# Patient Record
Sex: Male | Born: 1980 | Race: Black or African American
Health system: Southern US, Community
[De-identification: ages and names within clinical notes are randomized; demographics above are authoritative.]

---

## 2016-04-08 ENCOUNTER — Emergency Department: Payer: Self-pay

## 2016-04-08 ENCOUNTER — Emergency Department
Admission: EM | Admit: 2016-04-08 | Discharge: 2016-04-08 | Disposition: A | Discharge status: Home / Self Care | Payer: Self-pay | Attending: Student | Admitting: Student

## 2016-04-08 DIAGNOSIS — S92512A Displaced fracture of proximal phalanx of left lesser toe(s), initial encounter for closed fracture: Secondary | ICD-10-CM | POA: Insufficient documentation

## 2016-04-08 DIAGNOSIS — Y990 Civilian activity done for income or pay: Secondary | ICD-10-CM | POA: Insufficient documentation

## 2016-04-08 DIAGNOSIS — Y9389 Activity, other specified: Secondary | ICD-10-CM | POA: Insufficient documentation

## 2016-04-08 DIAGNOSIS — S92912A Unspecified fracture of left toe(s), initial encounter for closed fracture: Secondary | ICD-10-CM

## 2016-04-08 DIAGNOSIS — W208XXA Other cause of strike by thrown, projected or falling object, initial encounter: Secondary | ICD-10-CM | POA: Insufficient documentation

## 2016-04-08 DIAGNOSIS — Y929 Unspecified place or not applicable: Secondary | ICD-10-CM | POA: Insufficient documentation

## 2016-04-08 MED ORDER — IBUPROFEN 600 MG PO TABS: 600.0000 mg | ORAL_TABLET | Freq: Three times a day (TID) | ORAL | Status: DC | PRN

## 2016-04-08 MED ORDER — TRAMADOL HCL 50 MG PO TABS: 50.0000 mg | ORAL_TABLET | Freq: Four times a day (QID) | ORAL | Status: AC | PRN

## 2016-04-08 MED ORDER — TRAMADOL HCL 50 MG PO TABS
50.0000 mg | ORAL_TABLET | Freq: Once | ORAL | Status: AC
  Administered 2016-04-08: 50 mg via ORAL
  Filled 2016-04-08: qty 1

## 2016-04-08 MED ORDER — IBUPROFEN 600 MG PO TABS
600.0000 mg | ORAL_TABLET | Freq: Once | ORAL | Status: AC
  Administered 2016-04-08: 600 mg via ORAL
  Filled 2016-04-08: qty 1

## 2016-04-08 NOTE — ED Notes (Signed)
Pt was at work and box fell on left foot co pain to area, does not want to file workmans comp.

## 2016-04-08 NOTE — ED Provider Notes (Signed)
[facility] Emergency Department Provider Note   ____________________________________________  Time seen: Approximately 10:17 PM  I have reviewed the triage vital signs and the nursing notes.   HISTORY  Chief Complaint Foot Pain    HPI Austin Patrick is a 35 y.o. male patient complaining of pain to the second toe left foot secondary to blunt trauma. Patient state he dropped a box on his left foot. Patient states where he still toe shoes but is experiencing pain and edema on the trauma. Patient rates his pain as a 6/10. Incident occurred today. No palliative measures for this complaint.   No past medical history on file.  There are no active problems to display for this patient.   No past surgical history on file.  Current Outpatient Rx  Name  Route  Sig  Dispense  Refill  . ibuprofen (ADVIL,MOTRIN) 600 MG tablet   Oral   Take 1 tablet (600 mg total) by mouth every 8 (eight) hours as needed.   15 tablet   0   . traMADol (ULTRAM) 50 MG tablet   Oral   Take 1 tablet (50 mg total) by mouth every 6 (six) hours as needed.   20 tablet   0     Allergies Peanut-containing drug products  No family history on file.  Social History Social History  Substance Use Topics  . Smoking status: Not on file  . Smokeless tobacco: Not on file  . Alcohol Use: Not on file    Review of Systems Constitutional: No fever/chills Eyes: No visual changes. ENT: No sore throat. Cardiovascular: Denies chest pain. Respiratory: Denies shortness of breath. Gastrointestinal: No abdominal pain.  No nausea, no vomiting.  No diarrhea.  No constipation. Genitourinary: Negative for dysuria. Musculoskeletal: Pain to the second left toe. Skin: Negative for rash. Neurological: Negative for headaches, focal weakness or numbness. Allergic/Immunilogical: Peanuts   ____________________________________________   PHYSICAL EXAM:  VITAL SIGNS: ED Triage Vitals  Enc  Vitals Group     BP 04/08/16 2150 151/83 mmHg     Pulse Rate 04/08/16 2150 86     Resp 04/08/16 2150 18     Temp 04/08/16 2150 98.6 F (37 C)     Temp Source 04/08/16 2150 Oral     SpO2 04/08/16 2150 100 %     Weight 04/08/16 2150 240 lb (108.863 kg)     Height 04/08/16 2150 6\' 1"  (1.854 m)     Head Cir --      Peak Flow --      Pain Score 04/08/16 2151 6     Pain Loc --      Pain Edu? --      Excl. in GC? --     Constitutional: Alert and oriented. Well appearing and in no acute distress. Eyes: Conjunctivae are normal. PERRL. EOMI. Head: Atraumatic. Nose: No congestion/rhinnorhea. Mouth/Throat: Mucous membranes are moist.  Oropharynx non-erythematous. Neck: No stridor.  No cervical spine tenderness to palpation. Hematological/Lymphatic/Immunilogical: No cervical lymphadenopathy. Cardiovascular: Normal rate, regular rhythm. Grossly normal heart sounds.  Good peripheral circulation. Respiratory: Normal respiratory effort.  No retractions. Lungs CTAB. Gastrointestinal: Soft and nontender. No distention. No abdominal bruits. No CVA tenderness. Musculoskeletal: No lower extremity tenderness nor edema.  No joint effusions. Neurologic:  Normal speech and language. No gross focal neurologic deficits are appreciated. No gait instability. Skin:  Skin is warm, dry and intact. No rash noted. Psychiatric: Mood and affect are normal. Speech and behavior are normal.  ____________________________________________  LABS (all labs ordered are listed, but only abnormal results are displayed)  Labs Reviewed - No data to display ____________________________________________  EKG   ____________________________________________  RADIOLOGY  Fractures of the proximal phalange second digit left foot. ____________________________________________   PROCEDURES  Procedure(s) performed: None  Procedures  Critical Care performed: No  ____________________________________________   INITIAL  IMPRESSION / ASSESSMENT AND PLAN / ED COURSE  Pertinent labs & imaging results that were available during my care of the patient were reviewed by me and considered in my medical decision making (see chart for details). Fractures of the proximal phalange secondary to left foot. Because x-ray finding with patient. Patient toe was buddy taped. Patient placed an open shoe. Patient given a prescription for tramadol and ibuprofen. Patient given work limitations. Patient advised to follow-up with orthopedics if no improvement in his complaint. ____________________________________________   FINAL CLINICAL IMPRESSION(S) / ED DIAGNOSES  Final diagnoses:  Fracture of toe of left foot, closed, initial encounter      NEW MEDICATIONS STARTED DURING THIS VISIT:  New Prescriptions   IBUPROFEN (ADVIL,MOTRIN) 600 MG TABLET    Take 1 tablet (600 mg total) by mouth every 8 (eight) hours as needed.   TRAMADOL (ULTRAM) 50 MG TABLET    Take 1 tablet (50 mg total) by mouth every 6 (six) hours as needed.     Note:  This document was prepared using Dragon voice recognition software and may include unintentional dictation errors.    Joni Reining, PA-C 04/08/16 9147  Gayla Doss, MD 04/09/16 2322

## 2017-01-11 ENCOUNTER — Emergency Department
Admission: EM | Admit: 2017-01-11 | Discharge: 2017-01-11 | Disposition: A | Discharge status: Home / Self Care | Payer: BLUE CROSS/BLUE SHIELD | Attending: Emergency Medicine | Admitting: Emergency Medicine

## 2017-01-11 ENCOUNTER — Encounter: Payer: Self-pay | Admitting: Emergency Medicine

## 2017-01-11 DIAGNOSIS — J302 Other seasonal allergic rhinitis: Secondary | ICD-10-CM

## 2017-01-11 DIAGNOSIS — F17200 Nicotine dependence, unspecified, uncomplicated: Secondary | ICD-10-CM | POA: Insufficient documentation

## 2017-01-11 DIAGNOSIS — H578 Other specified disorders of eye and adnexa: Secondary | ICD-10-CM | POA: Diagnosis present

## 2017-01-11 MED ORDER — KETOTIFEN FUMARATE 0.025 % OP SOLN: 1.0000 [drp] | Freq: Two times a day (BID) | OPHTHALMIC | 0 refills | Status: DC

## 2017-01-11 MED ORDER — CETIRIZINE HCL 10 MG PO TABS: 10.0000 mg | ORAL_TABLET | Freq: Every day | ORAL | 0 refills | Status: DC

## 2017-01-11 NOTE — ED Provider Notes (Signed)
[facility] Emergency Department Provider Note  ____________________________________________  Time seen: Approximately 7:46 AM  I have reviewed the triage vital signs and the nursing notes.   HISTORY  Chief Complaint Eye Problem    HPI Austin Patrick is a 36 y.o. male that presents to the emergency department with eye lateral eye swelling for 2 weeks. Patient states that 2 weeks ago he was exposed to a significant amount of dust at work and noticed that his right eye started to swell. Right eye started to improve but then the left eye started to swell for no reason. It is only painful when he closes his eyes and presses on his eyelid. He states that he had several allergies as a kid. He has been using Visine, which is helping. He does not work contacts or glasses. He does not remember ever seeing a night doctor. He denies fever, headache, eye pain, eye redness, difficulty seeing, nasal congestion, shortness of breath, chest pain, nausea, vomiting, abdominal pain.   History reviewed. No pertinent past medical history.  There are no active problems to display for this patient.   History reviewed. No pertinent surgical history.  Prior to Admission medications   Medication Sig Start Date End Date Taking? Authorizing Provider  cetirizine (ZYRTEC) 10 MG tablet Take 1 tablet (10 mg total) by mouth daily. 01/11/17   Enid Derry, PA-C  ibuprofen (ADVIL,MOTRIN) 600 MG tablet Take 1 tablet (600 mg total) by mouth every 8 (eight) hours as needed. 04/08/16   Joni Reining, PA-C  ketotifen (ALLERGY EYE DROPS) 0.025 % ophthalmic solution Place 1 drop into both eyes 2 (two) times daily. 01/11/17   Enid Derry, PA-C  traMADol (ULTRAM) 50 MG tablet Take 1 tablet (50 mg total) by mouth every 6 (six) hours as needed. 04/08/16 04/08/17  Joni Reining, PA-C    Allergies Peanut-containing drug products  History reviewed. No pertinent family history.  Social History Social  History  Substance Use Topics  . Smoking status: Current Every Day Smoker  . Smokeless tobacco: Never Used  . Alcohol use No     Review of Systems  Constitutional: No fever/chills ENT: No upper respiratory complaints. Cardiovascular: No chest pain. Respiratory: No cough. No SOB. Gastrointestinal: No abdominal pain.  No nausea, no vomiting. Musculoskeletal: Negative for musculoskeletal pain. Skin: Negative for rash, abrasions, lacerations, ecchymosis. Neurological: Negative for headaches, numbness or tingling   ____________________________________________   PHYSICAL EXAM:  VITAL SIGNS: ED Triage Vitals  Enc Vitals Group     BP 01/11/17 0711 (!) 148/93     Pulse Rate 01/11/17 0711 86     Resp 01/11/17 0711 18     Temp 01/11/17 0711 97.2 F (36.2 C)     Temp Source 01/11/17 0711 Oral     SpO2 01/11/17 0711 100 %     Weight 01/11/17 0712 240 lb (108.9 kg)     Height --      Head Circumference --      Peak Flow --      Pain Score 01/11/17 0711 0     Pain Loc --      Pain Edu? --      Excl. in GC? --      Constitutional: Alert and oriented. Well appearing and in no acute distress. Eyes: Conjunctivae are normal. PERRL. EOMI. No tenderness to palpation. No swelling. Head: Atraumatic. ENT:      Ears:      Nose: No congestion/rhinnorhea.  Mouth/Throat: Mucous membranes are moist.  Neck: No stridor.   Cardiovascular: Normal rate, regular rhythm.  Good peripheral circulation. Respiratory: Normal respiratory effort without tachypnea or retractions. Lungs CTAB. Good air entry to the bases with no decreased or absent breath sounds. Musculoskeletal: Full range of motion to all extremities. No gross deformities appreciated. Neurologic:  Normal speech and language. No gross focal neurologic deficits are appreciated.  Skin:  Skin is warm, dry and intact. No rash noted.   ____________________________________________   LABS (all labs ordered are listed, but only abnormal  results are displayed)  Labs Reviewed - No data to display ____________________________________________  EKG   ____________________________________________  RADIOLOGY  No results found.  ____________________________________________    PROCEDURES  Procedure(s) performed:    Procedures    Medications - No data to display   ____________________________________________   INITIAL IMPRESSION / ASSESSMENT AND PLAN / ED COURSE  Pertinent labs & imaging results that were available during my care of the patient were reviewed by me and considered in my medical decision making (see chart for details).  Review of the [place] CSRS was performed in accordance of the [facility] prior to dispensing any controlled drugs.     Patient's diagnosis is consistent with allergies. Vital signs and exam are reassuring. He denies any eye pain or difficulty seeing. Symptoms are improving with Visine. Patient will be discharged home with prescriptions for cetirizine and ketotifen eye drops. Patient is to follow up with PCP as directed. Patient is given ED precautions to return to the ED for any worsening or new symptoms.     ____________________________________________  FINAL CLINICAL IMPRESSION(S) / ED DIAGNOSES  Final diagnoses:  Seasonal allergic rhinitis, unspecified trigger      NEW MEDICATIONS STARTED DURING THIS VISIT:  New Prescriptions   CETIRIZINE (ZYRTEC) 10 MG TABLET    Take 1 tablet (10 mg total) by mouth daily.   KETOTIFEN (ALLERGY EYE DROPS) 0.025 % OPHTHALMIC SOLUTION    Place 1 drop into both eyes 2 (two) times daily.        This chart was dictated using voice recognition software/Dragon. Despite best efforts to proofread, errors can occur which can change the meaning. Any change was purely unintentional.    Enid Derry, PA-C 01/11/17 0981    Jene Every, MD 01/11/17 1036

## 2017-01-11 NOTE — ED Triage Notes (Signed)
Pt to ed with c/o swelling to bilat eyes x 1 week.  No drainage noted.

## 2017-02-17 ENCOUNTER — Encounter: Payer: Self-pay | Admitting: Emergency Medicine

## 2017-02-17 ENCOUNTER — Emergency Department
Admission: EM | Admit: 2017-02-17 | Discharge: 2017-02-17 | Disposition: A | Discharge status: Home / Self Care | Payer: BLUE CROSS/BLUE SHIELD | Attending: Emergency Medicine | Admitting: Emergency Medicine

## 2017-02-17 DIAGNOSIS — F17200 Nicotine dependence, unspecified, uncomplicated: Secondary | ICD-10-CM | POA: Insufficient documentation

## 2017-02-17 DIAGNOSIS — H00014 Hordeolum externum left upper eyelid: Secondary | ICD-10-CM | POA: Insufficient documentation

## 2017-02-17 DIAGNOSIS — H00024 Hordeolum internum left upper eyelid: Secondary | ICD-10-CM

## 2017-02-17 DIAGNOSIS — H02846 Edema of left eye, unspecified eyelid: Secondary | ICD-10-CM | POA: Diagnosis present

## 2017-02-17 MED ORDER — TOBRAMYCIN 0.3 % OP SOLN: 2.0000 [drp] | OPHTHALMIC | 0 refills | Status: AC

## 2017-02-17 NOTE — ED Notes (Signed)
Presents with left eye swelling for about 2 weeks  States eye is tender to touch  Has been seen for same  Denies any injury or drainage

## 2017-02-17 NOTE — ED Triage Notes (Signed)
Pt reports left eye swelling and itching that began last night. Denies pain.

## 2017-02-17 NOTE — ED Provider Notes (Signed)
[facility] Emergency Department Provider Note ____________________________________________  Time seen: Approximately 8:09 AM  I have reviewed the triage vital signs and the nursing notes.   HISTORY  Chief Complaint Conjunctivitis   HPI Austin Patrick is a 36 y.o. male who presents to the emergency department for evaluation of left eye swelling and drainage for the past 2 weeks. Eye is tender to touch. He was started on allergy drops, but they have not helped at all. Eye is itching and when he awakens, it is matted.  History reviewed. No pertinent past medical history.  There are no active problems to display for this patient.   History reviewed. No pertinent surgical history.  Prior to Admission medications   Medication Sig Start Date End Date Taking? Authorizing Provider  cetirizine (ZYRTEC) 10 MG tablet Take 1 tablet (10 mg total) by mouth daily. 01/11/17   Enid DerryWagner, Ashley, PA-C  ibuprofen (ADVIL,MOTRIN) 600 MG tablet Take 1 tablet (600 mg total) by mouth every 8 (eight) hours as needed. 04/08/16   Joni ReiningSmith, Ronald K, PA-C  tobramycin (TOBREX) 0.3 % ophthalmic solution Place 2 drops into the left eye every 4 (four) hours. 02/17/17 02/24/17  Raydin Bielinski, Kasandra Knudsenari B, FNP  traMADol (ULTRAM) 50 MG tablet Take 1 tablet (50 mg total) by mouth every 6 (six) hours as needed. 04/08/16 04/08/17  Joni ReiningSmith, Ronald K, PA-C    Allergies Peanut-containing drug products  No family history on file.  Social History Social History  Substance Use Topics  . Smoking status: Current Every Day Smoker  . Smokeless tobacco: Never Used  . Alcohol use No    Review of Systems   Constitutional: No fever/chills Eyes: Negative for visual changes. Negative for pain. Musculoskeletal: Negative for pain. Skin: Negative for rash. Neurological: Negative for headaches, focal weakness or numbness. Allergic: Positive for seasonal allergies. ____________________________________________  PHYSICAL  EXAM:  VITAL SIGNS: ED Triage Vitals  Enc Vitals Group     BP 02/17/17 0728 (!) 144/77     Pulse Rate 02/17/17 0728 74     Resp 02/17/17 0728 18     Temp 02/17/17 0728 98.4 F (36.9 C)     Temp Source 02/17/17 0728 Oral     SpO2 02/17/17 0728 99 %     Weight 02/17/17 0729 245 lb (111.1 kg)     Height 02/17/17 0729 6\' 1"  (1.854 m)     Head Circumference --      Peak Flow --      Pain Score --      Pain Loc --      Pain Edu? --      Excl. in GC? --     Constitutional: Alert and oriented. Well appearing and in no acute distress. Eyes: Visual acuity--see nursing documentation; No globe trauma; Eyelids normal to inspection; Sclera appears anicteric.  Eyelids not inverted. Conjunctiva appears mildly erythematous and slightly injected; hordeolum noted on the inside, upper lid. Cornea appears clear on unstained exam. Head: Atraumatic. Nose: No congestion/rhinnorhea. Mouth/Throat: Mucous membranes are moist.  Oropharynx non-erythematous. Respiratory: Even and unlabored. Musculoskeletal:Normal ROM x 4 extremities. Neurologic:  Normal speech and language. No gross focal neurologic deficits are appreciated. Speech is normal. No gait instability. Skin:  Skin is warm, dry and intact. No rash noted. Psychiatric: Mood and affect are normal. Speech and behavior are normal.  ____________________________________________   LABS (all labs ordered are listed, but only abnormal results are displayed)  Labs Reviewed - No data to display ____________________________________________  EKG  ____________________________________________  RADIOLOGY   ____________________________________________   PROCEDURES  Procedure(s) performed: None ____________________________________________   INITIAL IMPRESSION / ASSESSMENT AND PLAN / ED COURSE  36 year old male presenting to the ER for evaluation of eye swelling, itching, and drainage. No relief with ketotifen. He will be prescribed Tobramycin  and advised to follow up with ophthalmology for symptoms that are not improving over the next few days. He is to return to the ER for symptoms that change or worsen if unable to schedule an appointment.  Pertinent labs & imaging results that were available during my care of the patient were reviewed by me and considered in my medical decision making (see chart for details). ____________________________________________   FINAL CLINICAL IMPRESSION(S) / ED DIAGNOSES  Final diagnoses:  Hordeolum internum of left upper eyelid    Note:  This document was prepared using Dragon voice recognition software and may include unintentional dictation errors.    Chinita Pester, FNP 02/22/17 9147    Minna Antis, MD 02/23/17 [phone]

## 2017-02-27 ENCOUNTER — Emergency Department: Payer: BLUE CROSS/BLUE SHIELD

## 2017-02-27 ENCOUNTER — Emergency Department
Admission: EM | Admit: 2017-02-27 | Discharge: 2017-02-27 | Disposition: A | Discharge status: Home / Self Care | Payer: BLUE CROSS/BLUE SHIELD | Attending: Emergency Medicine | Admitting: Emergency Medicine

## 2017-02-27 ENCOUNTER — Encounter: Payer: Self-pay | Admitting: *Deleted

## 2017-02-27 DIAGNOSIS — Y998 Other external cause status: Secondary | ICD-10-CM | POA: Insufficient documentation

## 2017-02-27 DIAGNOSIS — S92215A Nondisplaced fracture of cuboid bone of left foot, initial encounter for closed fracture: Secondary | ICD-10-CM | POA: Diagnosis not present

## 2017-02-27 DIAGNOSIS — W1789XA Other fall from one level to another, initial encounter: Secondary | ICD-10-CM | POA: Insufficient documentation

## 2017-02-27 DIAGNOSIS — Z91010 Allergy to peanuts: Secondary | ICD-10-CM | POA: Diagnosis not present

## 2017-02-27 DIAGNOSIS — Y929 Unspecified place or not applicable: Secondary | ICD-10-CM | POA: Diagnosis not present

## 2017-02-27 DIAGNOSIS — Z79899 Other long term (current) drug therapy: Secondary | ICD-10-CM | POA: Insufficient documentation

## 2017-02-27 DIAGNOSIS — Y9389 Activity, other specified: Secondary | ICD-10-CM | POA: Diagnosis not present

## 2017-02-27 DIAGNOSIS — F17200 Nicotine dependence, unspecified, uncomplicated: Secondary | ICD-10-CM | POA: Insufficient documentation

## 2017-02-27 DIAGNOSIS — S99922A Unspecified injury of left foot, initial encounter: Secondary | ICD-10-CM | POA: Diagnosis present

## 2017-02-27 MED ORDER — IBUPROFEN 600 MG PO TABS
600.0000 mg | ORAL_TABLET | Freq: Four times a day (QID) | ORAL | 0 refills | Status: AC | PRN
Start: 2017-02-27 — End: ?

## 2017-02-27 MED ORDER — HYDROCODONE-ACETAMINOPHEN 5-325 MG PO TABS
1.0000 | ORAL_TABLET | ORAL | Status: AC
  Administered 2017-02-27: 1 via ORAL
  Filled 2017-02-27: qty 1

## 2017-02-27 MED ORDER — HYDROCODONE-ACETAMINOPHEN 5-325 MG PO TABS: 1.0000 | ORAL_TABLET | ORAL | 0 refills | Status: AC | PRN

## 2017-02-27 NOTE — ED Provider Notes (Signed)
[facility]-EMERGENCY DEPARTMENT Provider Note   CSN: [id number] Arrival date & time: 02/27/17  1549     History   Chief Complaint Chief Complaint  Patient presents with  . Foot Pain    HPI Austin Patrick is a 36 y.o. male Presents to the emergency department for evaluation of left foot pain. 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 He denies any other trauma to his body. His pain is moderate along the left lateral foot with swelling.He denies hitting his head, losing consciousness. No headache, neck pain, back pain. He has not had any medications for pain.  HPI  History reviewed. No pertinent past medical history.  There are no active problems to display for this patient.   History reviewed. No pertinent surgical history.     Home Medications    Prior to Admission medications   Medication Sig Start Date End Date Taking? Authorizing Provider  HYDROcodone-acetaminophen (NORCO) 5-325 MG tablet Take 1 tablet by mouth every 4 (four) hours as needed for moderate pain. 02/27/17   Evon SlackGaines, Jasraj Lappe C, PA-C  ibuprofen (ADVIL,MOTRIN) 600 MG tablet Take 1 tablet (600 mg total) by mouth every 6 (six) hours as needed for moderate pain. 02/27/17   Evon SlackGaines, Mattthew Ziomek C, PA-C  traMADol (ULTRAM) 50 MG tablet Take 1 tablet (50 mg total) by mouth every 6 (six) hours as needed. 04/08/16 04/08/17  Joni ReiningSmith, Ronald K, PA-C    Family History History reviewed. No pertinent family history.  Social History Social History  Substance Use Topics  . Smoking status: Current Every Day Smoker  . Smokeless tobacco: Never Used  . Alcohol use No     Allergies   Peanut-containing drug products   Review of Systems Review of Systems  Constitutional: Negative.  Negative for activity change, appetite change, chills and fever.  HENT: Negative for congestion, ear pain, sore  throat and trouble swallowing.   Eyes: Negative for photophobia, pain and discharge.  Respiratory: Negative for cough, chest tightness and shortness of breath.   Cardiovascular: Negative for chest pain and leg swelling.  Gastrointestinal: Negative for abdominal distention, abdominal pain, diarrhea, nausea and vomiting.  Genitourinary: Negative for difficulty urinating and dysuria.  Musculoskeletal: Positive for arthralgias, gait problem and joint swelling. Negative for back pain.  Skin: Negative for color change and rash.  Neurological: Negative for dizziness, weakness, light-headedness, numbness and headaches.  Hematological: Negative for adenopathy.  Psychiatric/Behavioral: Negative for agitation and behavioral problems.     Physical Exam Updated Vital Signs BP (!) 143/86 (BP Location: Left Arm)   Pulse 78   Temp 98.3 F (36.8 C) (Oral)   Resp 16   Ht 6\' 1"  (1.854 m)   Wt 111.1 kg (245 lb)   SpO2 100%   BMI 32.32 kg/m   Physical Exam  Constitutional: He is oriented to person, place, and time. He appears well-developed and well-nourished.  HENT:  Head: Normocephalic and atraumatic.  Eyes: Conjunctivae and EOM are normal. Pupils are equal, round, and reactive to light.  Neck: Normal range of motion. Neck supple.  Cardiovascular: Normal rate, regular rhythm, normal heart sounds and intact distal pulses.   Pulmonary/Chest: Effort normal and breath sounds normal. No respiratory distress. He has no wheezes. He has no rales. He exhibits no tenderness.  Abdominal: Soft. Bowel sounds are normal. He exhibits no distension. There is no tenderness.  Musculoskeletal:  Examination of the left foot shows patient has swelling on the lateral aspect of the foot along the base of fifth metatarsal. He is tender to palpation along the base of fifth metatarsal. He is nontender along the medial aspect of the foot. Nontender along the calcaneus or Achilles tendon. No skin breakdown noted.    Neurological: He is alert and oriented to person, place, and time.  Skin: Skin is warm and dry.  Psychiatric: He has a normal mood and affect. His behavior is normal. Judgment and thought content normal.     ED Treatments / Results  Labs (all labs ordered are listed, but only abnormal results are displayed) Labs Reviewed - No data to display  EKG  EKG Interpretation None       Radiology Dg Foot Complete Left  Result Date: 02/27/2017 CLINICAL DATA:  Lateral left foot pain after jumping off truck. EXAM: LEFT FOOT - COMPLETE 3+ VIEW COMPARISON:  04/08/2016 left foot radiographs FINDINGS: Prominent soft tissue swelling in the lateral proximal left foot. Focal cortical irregularity at the lateral proximal margin of the cuboid, new since 04/08/2016, suspicious for nondisplaced fracture. No additional fracture. No dislocation. No significant arthropathy. No suspicious focal osseous lesion. No radiopaque foreign body. IMPRESSION: Focal cortical irregularity at the lateral proximal margin of the cuboid with surrounding prominent soft tissue swelling, new since 04/08/2016 radiographs, suspicious for nondisplaced cuboid fracture. Electronically Signed   By: Delbert Phenix M.D.   On: 02/27/2017 16:33    Procedures Procedures (including critical care time) SPLINT APPLICATION Date/Time: 4:57 PM Authorized by: Patience Musca Consent: Verbal consent obtained. Risks and benefits: risks, benefits and alternatives were discussed Consent given by: patient Splint applied by: ED tech Location details: Left foot Splint type: Posterior splint short leg left lower extremity Supplies used: Prewrap, Ace wrap, Ortho-Glass, crutches Post-procedure: The splinted body part was neurovascularly unchanged following the procedure. Patient tolerance: Patient tolerated the procedure well with no immediate complications.     Medications Ordered in ED Medications  HYDROcodone-acetaminophen  (NORCO/VICODIN) 5-325 MG per tablet 1 tablet (1 tablet Oral Given 02/27/17 1642)     Initial Impression / Assessment and Plan / ED Course  I have reviewed the triage vital signs and the nursing notes.  Pertinent labs & imaging results that were available during my care of the patient were reviewed by me and considered in my medical decision making (see chart for details).     36 year old male with left nondisplaced cuboid fracture. He is placed into a posterior short leg splint, given crutches for ambulation. He will be nonweightbearing. He'll rest ice and elevate. He'll follow-up with podiatry. Ibuprofen and Norco as needed for pain.  Final Clinical Impressions(s) / ED Diagnoses   Final diagnoses:  Nondisplaced fracture of cuboid bone of left foot, initial encounter for closed fracture    New Prescriptions New Prescriptions   HYDROCODONE-ACETAMINOPHEN (NORCO) 5-325 MG TABLET    Take 1 tablet by mouth every 4 (four) hours as needed for moderate pain.   IBUPROFEN (ADVIL,MOTRIN) 600 MG TABLET    Take 1 tablet (600 mg total) by mouth every 6 (six) hours as needed for moderate pain.     Evon Slack, PA-C 02/27/17 1658    Merrily Brittle, MD 02/27/17 [phone]

## 2017-02-27 NOTE — Discharge Instructions (Signed)
Please use crutches to help with walking. Avoid putting weight on her left foot. Rest ice and elevate the left foot. Take ibuprofen and/or Norco as needed for pain. Return to the ER for any worsening symptoms urgent changes in her health. Follow-up with podiatry.

## 2017-02-27 NOTE — ED Triage Notes (Addendum)
States he jumped off the truck today and landed on a pipe, now states left foot pain

## 2017-02-27 NOTE — ED Notes (Signed)
See triage note  States he jumped off truck  Landed on his feet  But hit a pipe with left foot  Positive swelling noted  Increased pain with standing  Positive pulses

## 2017-03-02 DIAGNOSIS — S92902D Unspecified fracture of left foot, subsequent encounter for fracture with routine healing: Secondary | ICD-10-CM | POA: Diagnosis present

## 2017-03-02 DIAGNOSIS — R2242 Localized swelling, mass and lump, left lower limb: Secondary | ICD-10-CM | POA: Diagnosis not present

## 2017-03-02 DIAGNOSIS — Z5321 Procedure and treatment not carried out due to patient leaving prior to being seen by health care provider: Secondary | ICD-10-CM

## 2017-03-02 DIAGNOSIS — Z79899 Other long term (current) drug therapy: Secondary | ICD-10-CM | POA: Diagnosis not present

## 2017-03-02 DIAGNOSIS — Y658 Other specified misadventures during surgical and medical care: Secondary | ICD-10-CM | POA: Diagnosis not present

## 2017-03-02 DIAGNOSIS — F17200 Nicotine dependence, unspecified, uncomplicated: Secondary | ICD-10-CM | POA: Insufficient documentation

## 2017-03-02 DIAGNOSIS — S92215D Nondisplaced fracture of cuboid bone of left foot, subsequent encounter for fracture with routine healing: Secondary | ICD-10-CM | POA: Diagnosis not present

## 2017-03-02 DIAGNOSIS — Z91010 Allergy to peanuts: Secondary | ICD-10-CM | POA: Insufficient documentation

## 2017-03-02 NOTE — ED Triage Notes (Addendum)
Pt with recent left foot fracture on 02/27/2017. Pt states he is having intermittent numbness to left toes with leg dependent and elevated. Cms currently intact to all left toes. Toes are warm/normal color and with minimal edema. Splint intact.

## 2017-03-02 NOTE — ED Notes (Signed)
Spoke with dr. Langston Maskershaevitz regarding pt's presenting complaint/symptoms and recent injury. No new orders received at this time. Pt and family updated on results of md conversation.

## 2017-03-03 ENCOUNTER — Emergency Department
Admission: EM | Admit: 2017-03-03 | Discharge: 2017-03-03 | Disposition: A | Discharge status: Home / Self Care | Payer: BLUE CROSS/BLUE SHIELD | Attending: Emergency Medicine | Admitting: Emergency Medicine

## 2017-03-03 ENCOUNTER — Encounter: Payer: Self-pay | Admitting: Emergency Medicine

## 2017-03-03 ENCOUNTER — Emergency Department
Admission: EM | Admit: 2017-03-03 | Discharge: 2017-03-03 | Discharge status: Against Medical Advice | Payer: BLUE CROSS/BLUE SHIELD | Source: Home / Self Care

## 2017-03-03 ENCOUNTER — Telehealth: Payer: Self-pay | Admitting: Emergency Medicine

## 2017-03-03 DIAGNOSIS — Z4789 Encounter for other orthopedic aftercare: Secondary | ICD-10-CM

## 2017-03-03 DIAGNOSIS — S92215D Nondisplaced fracture of cuboid bone of left foot, subsequent encounter for fracture with routine healing: Secondary | ICD-10-CM

## 2017-03-03 MED ORDER — IBUPROFEN 600 MG PO TABS: 600.0000 mg | ORAL_TABLET | Freq: Three times a day (TID) | ORAL | 0 refills | Status: AC | PRN

## 2017-03-03 MED ORDER — HYDROCODONE-ACETAMINOPHEN 5-325 MG PO TABS: 1.0000 | ORAL_TABLET | Freq: Three times a day (TID) | ORAL | 0 refills | Status: AC | PRN

## 2017-03-03 NOTE — ED Provider Notes (Signed)
[facility] Emergency Department Provider Note ____________________________________________  Time seen: 1610  I have reviewed the triage vital signs and the nursing notes.  HISTORY  Chief Complaint  Foot fracture recheck and Cast Problem  HPI Austin Patrick is a 36 y.o. male Returns to the ED for evaluation of hisleft foot fracture and splint care. He reports the initial splint was placed along the entire length of his posterior leg, has caused some irritation to the back of his knee. He also notes some increased swelling to the foot. He denies any reinjury in the interim. He is taking his medications as previously prescribed. He is scheduled to see podiatry on June 19.  History reviewed. No pertinent past medical history.  There are no active problems to display for this patient.  History reviewed. No pertinent surgical history.  Prior to Admission medications   Medication Sig Start Date End Date Taking? Authorizing Provider  HYDROcodone-acetaminophen (NORCO) 5-325 MG tablet Take 1 tablet by mouth every 4 (four) hours as needed for moderate pain. 02/27/17   Evon SlackGaines, Thomas C, PA-C  HYDROcodone-acetaminophen (NORCO) 5-325 MG tablet Take 1 tablet by mouth 3 (three) times daily as needed. 03/03/17 03/08/17  Braedyn Kauk, Charlesetta IvoryJenise V Bacon, PA-C  ibuprofen (ADVIL,MOTRIN) 600 MG tablet Take 1 tablet (600 mg total) by mouth every 6 (six) hours as needed for moderate pain. 02/27/17   Evon SlackGaines, Thomas C, PA-C  ibuprofen (ADVIL,MOTRIN) 600 MG tablet Take 1 tablet (600 mg total) by mouth every 8 (eight) hours as needed. 03/03/17   Yaasir Menken, Charlesetta IvoryJenise V Bacon, PA-C  traMADol (ULTRAM) 50 MG tablet Take 1 tablet (50 mg total) by mouth every 6 (six) hours as needed. 04/08/16 04/08/17  Joni ReiningSmith, Ronald K, PA-C    Allergies Peanut-containing drug products  No family history on file.  Social History Social History  Substance Use Topics  . Smoking status: Current Every Day Smoker  . Smokeless  tobacco: Never Used  . Alcohol use No    Review of Systems  Constitutional: Negative for fever. Cardiovascular: Negative for chest pain. Respiratory: Negative for shortness of breath. Musculoskeletal: Negative for back pain. Left foot swelling and cast irritation. Skin: Negative for rash. Neurological: Negative for headaches, focal weakness or numbness. ____________________________________________  PHYSICAL EXAM:  VITAL SIGNS: ED Triage Vitals  Enc Vitals Group     BP 03/03/17 1530 134/82     Pulse Rate 03/03/17 1530 69     Resp 03/03/17 1530 18     Temp 03/03/17 1530 98.5 F (36.9 C)     Temp Source 03/03/17 1530 Oral     SpO2 03/03/17 1530 100 %     Weight 03/03/17 1528 240 lb (108.9 kg)     Height 03/03/17 1528 6\' 1"  (1.854 m)     Head Circumference --      Peak Flow --      Pain Score 03/03/17 1528 7     Pain Loc --      Pain Edu? --      Excl. in GC? --     Constitutional: Alert and oriented. Well appearing and in no distress. Head: Normocephalic and atraumatic. Cardiovascular: Normal rate, regular rhythm. Normal distal pulses. Respiratory: Normal respiratory effort.  Musculoskeletal: Left foot with edema noted. Tenderness to palp over the dorsal foot. Nontender with normal range of motion in all extremities.  Neurologic:  Antalgic, crutch-assisted, non-weightbearing gait without ataxia. Normal speech and language. No gross focal neurologic deficits are appreciated. Skin:  Skin is warm,  dry and intact. No rash noted. ____________________________________________  PROCEDURES  Short leg OCL splint ____________________________________________  INITIAL IMPRESSION / ASSESSMENT AND PLAN / ED COURSE  Patient with return ED visit for splint or cast problems causing some local swelling and irritation. His splint is reapplied terminating at the proximal mid calf. He is further referred to podiatry for management and fracture care. He will otherwise keep his appointment  on June 19 at [facility] clinic. He is advised rest, ice, elevate the foot and avoid any weightbearing gait. He will return to the ED as needed. His medications are refilled for hydrocodone and ibuprofen. ____________________________________________  FINAL CLINICAL IMPRESSION(S) / ED DIAGNOSES  Final diagnoses:  Closed nondisplaced fracture of cuboid of left foot with routine healing, subsequent encounter  Problem with immobilizing cast      [name], Charlesetta Ivory, PA-C 03/03/17 1643    Loleta Rose, MD 03/03/17 1819

## 2017-03-03 NOTE — ED Triage Notes (Signed)
Pt reports here recently for left foot fracture. Returned yesterday to be seen for numbness to left toes but left due to the wait. Pt reports pain up into his left knee. Pt would also like to see if splint can be shortened.

## 2017-03-03 NOTE — ED Notes (Signed)
Pt called from lobby 4x times with no response, first RN notified

## 2017-03-03 NOTE — Telephone Encounter (Signed)
Called patient due to lwot to inquire about condition and follow up plans. Person who answered the phone says he is not there, but that he is planning to come back here for exam this afternoon.

## 2017-03-03 NOTE — ED Notes (Signed)
See triage note  Was seen couple of days ago for injury to left foot.Austin Patrick. ocl splint in place on arrival  Swelling noted to foot..but feels like splint is too long and is rubbing behind his knee

## 2017-03-03 NOTE — Discharge Instructions (Signed)
You have a foot fracture. Your splint has been reapplied. Keep it in place until you are cleared by ortho. Apply ice packs over the splint to reduce swelling. Rest with the foot elevated when seated. Avoid bearing any weight through the foot. Consider calling Dr. Ether GriffinsFowler for follow-up care.

## 2018-01-05 IMAGING — DX DG FOOT COMPLETE 3+V*L*
3 series · 3 of 3 positions shown · non-contrast
Comparison: 04/08/2016 left foot radiographs

CLINICAL DATA: Lateral left foot pain after jumping off truck.

EXAM:
LEFT FOOT - COMPLETE 3+ VIEW

[foot ap]
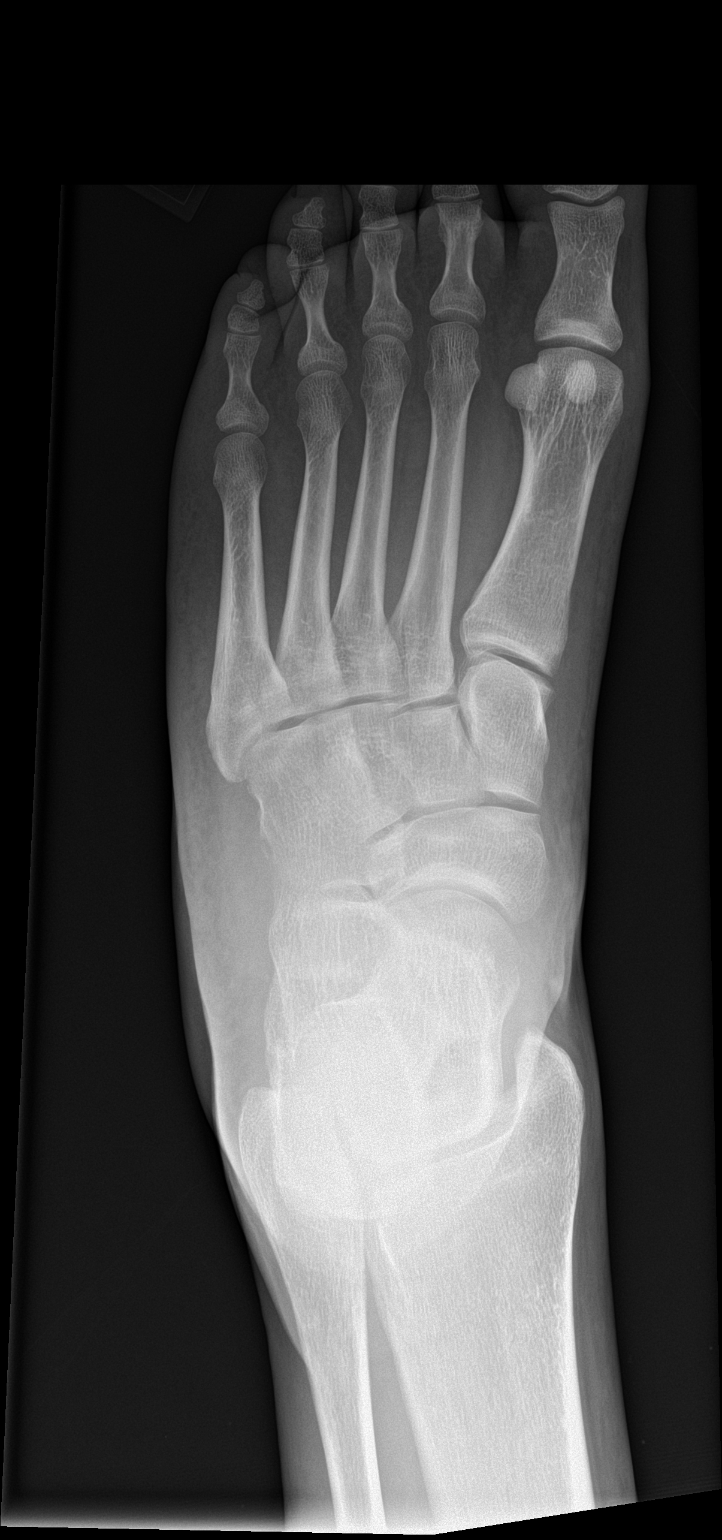

[foot obl]
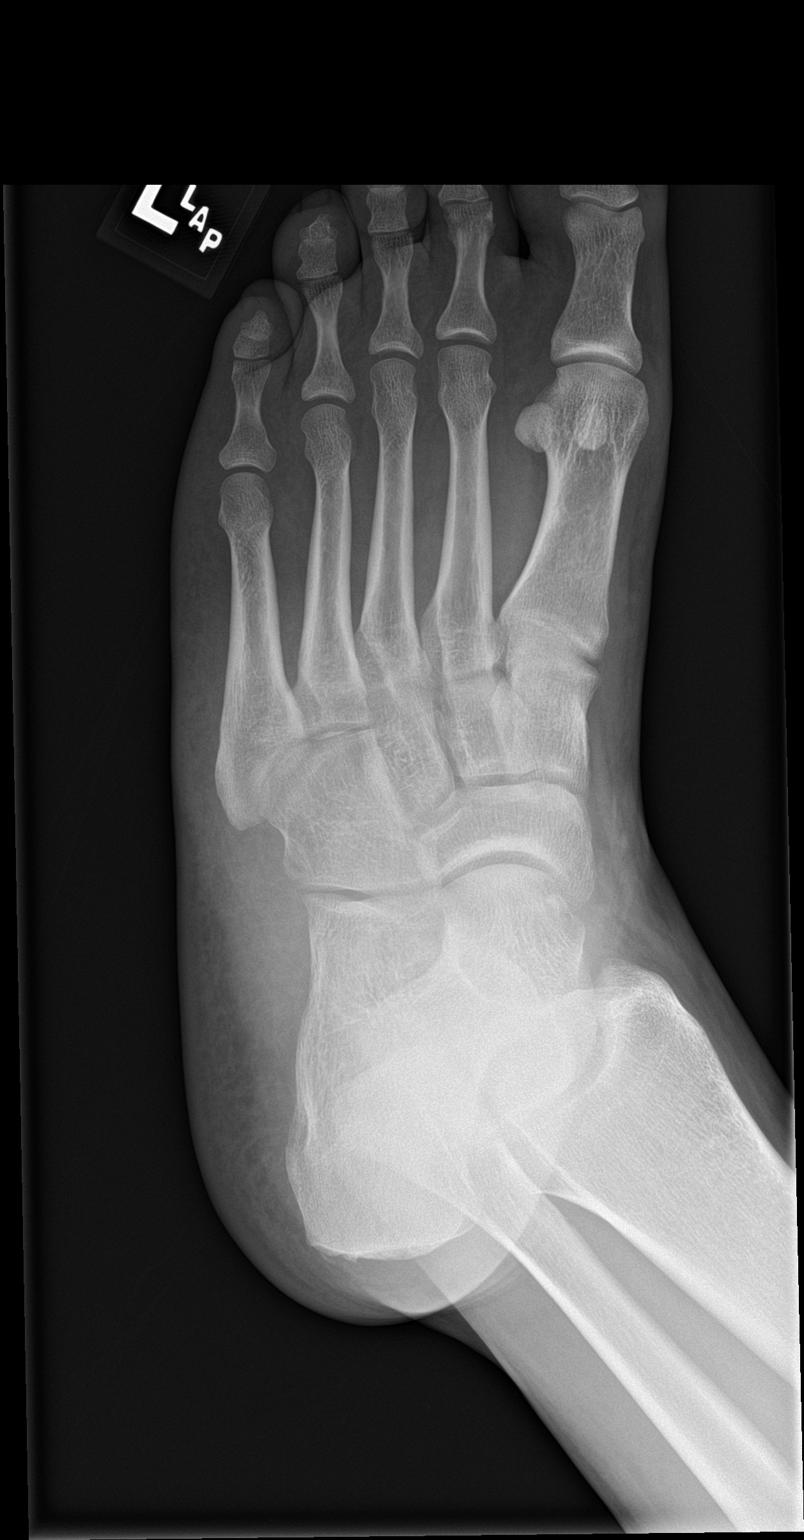

[foot lat]
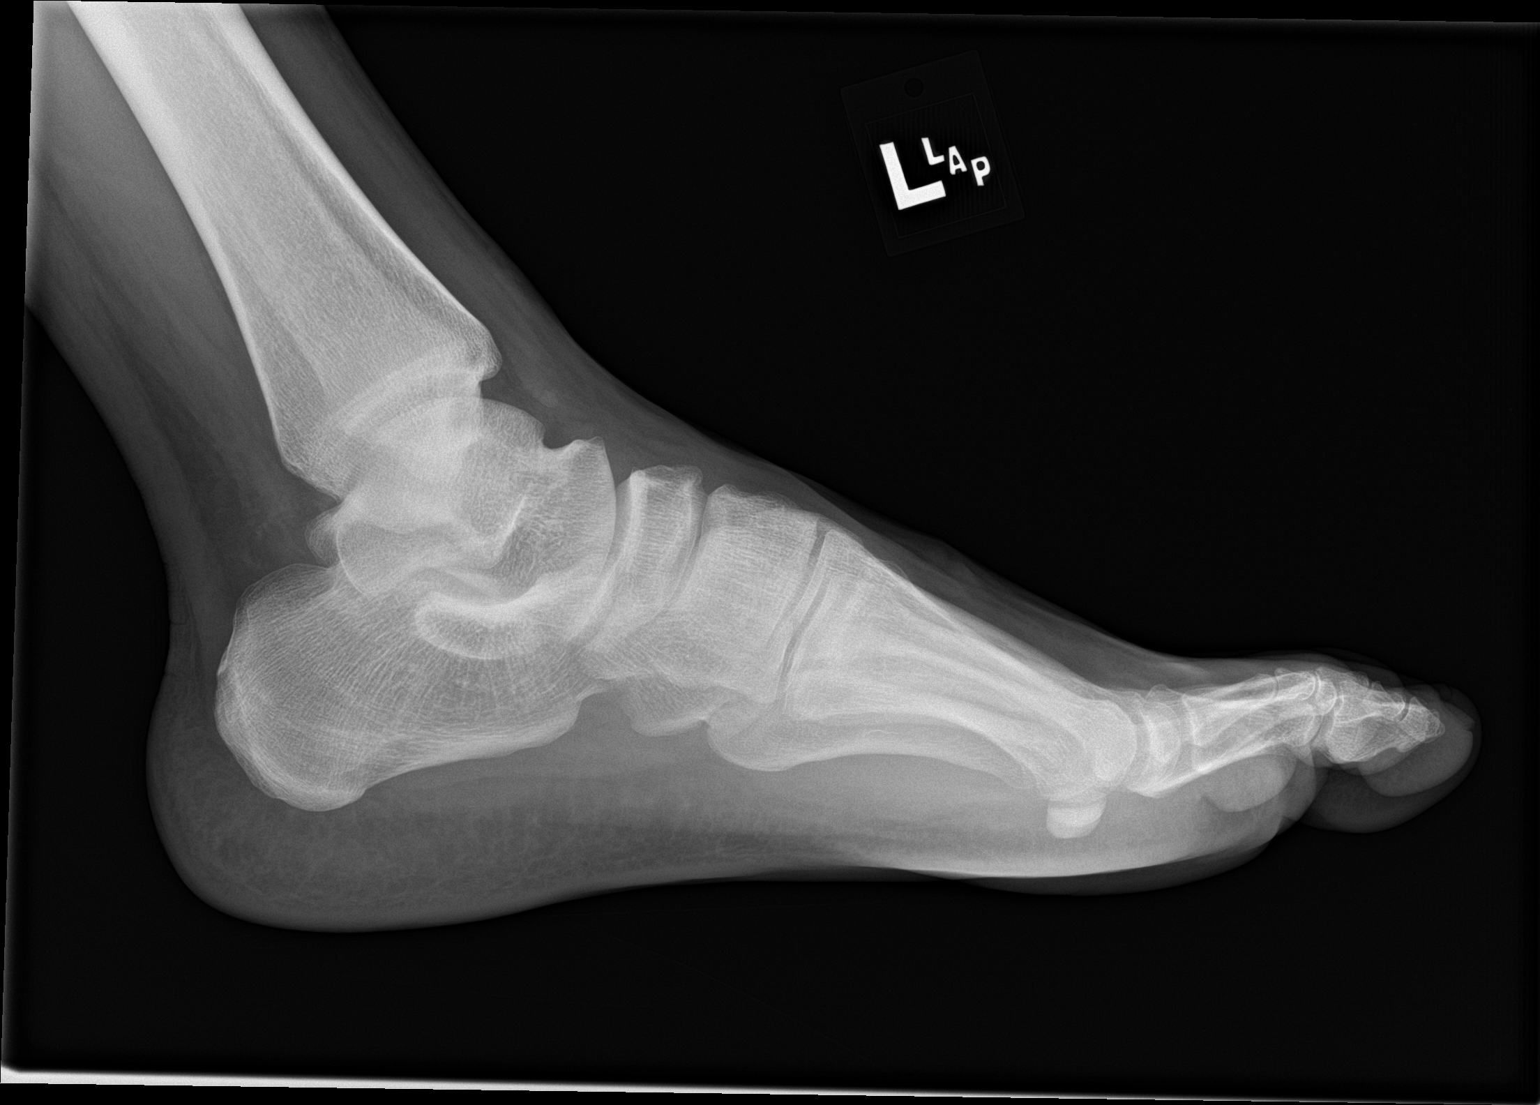

[3 of 3 positions shown; findings below may reference images not displayed]

FINDINGS: Prominent soft tissue swelling in the lateral proximal left foot.
Focal cortical irregularity at the lateral proximal margin of the
cuboid, new since 04/08/2016, suspicious for nondisplaced fracture.
No additional fracture. No dislocation. No significant arthropathy.
No suspicious focal osseous lesion. No radiopaque foreign body.
IMPRESSION: Focal cortical irregularity at the lateral proximal margin of the
cuboid with surrounding prominent soft tissue swelling, new since
04/08/2016 radiographs, suspicious for nondisplaced cuboid fracture.

## 2019-12-25 ENCOUNTER — Ambulatory Visit: Payer: Self-pay | Attending: Internal Medicine

## 2019-12-25 DIAGNOSIS — Z23 Encounter for immunization: Secondary | ICD-10-CM

## 2019-12-25 NOTE — Progress Notes (Signed)
   Covid-19 Vaccination Clinic  Name:  Austin Patrick    MRN: [id number] DOB: Oct 04, 1980  12/25/2019  Austin Patrick was observed post Covid-19 immunization for 15 minutes without incident. He was provided with Vaccine Information Sheet and instruction to access the V-Safe system.   Austin Patrick was instructed to call 911 with any severe reactions post vaccine: Marland Kitchen Difficulty breathing  . Swelling of face and throat  . A fast heartbeat  . A bad rash all over body  . Dizziness and weakness   Immunizations Administered    Name Date Dose VIS Date Route   Pfizer COVID-19 Vaccine 12/25/2019  8:09 AM 0.3 mL 09/03/2019 Intramuscular   Manufacturer: [employer], [employer]   Lot: [id number]   NDC: [address]-4

## 2020-01-18 ENCOUNTER — Ambulatory Visit: Payer: Self-pay | Attending: Internal Medicine

## 2020-01-18 DIAGNOSIS — Z23 Encounter for immunization: Secondary | ICD-10-CM

## 2020-01-18 NOTE — Progress Notes (Signed)
   Covid-19 Vaccination Clinic  Name:  Austin Patrick    MRN: [id number] DOB: Oct 29, 1980  01/18/2020  Austin Patrick was observed post Covid-19 immunization for 15 minutes without incident. He was provided with Vaccine Information Sheet and instruction to access the V-Safe system.   Austin Patrick was instructed to call 911 with any severe reactions post vaccine: Marland Kitchen Difficulty breathing  . Swelling of face and throat  . A fast heartbeat  . A bad rash all over body  . Dizziness and weakness   Immunizations Administered    Name Date Dose VIS Date Route   Pfizer COVID-19 Vaccine 01/18/2020  3:38 PM 0.3 mL 11/17/2018 Intramuscular   Manufacturer: [employer], [employer]   Lot: [id number]   NDC: [address]-5

## 2022-10-22 ENCOUNTER — Emergency Department (HOSPITAL_COMMUNITY)
Admission: EM | Admit: 2022-10-22 | Discharge: 2022-10-22 | Disposition: A | Discharge status: Home / Self Care | Payer: BLUE CROSS/BLUE SHIELD | Attending: Emergency Medicine | Admitting: Emergency Medicine

## 2022-10-22 ENCOUNTER — Encounter (HOSPITAL_COMMUNITY): Payer: Self-pay

## 2022-10-22 DIAGNOSIS — A568 Sexually transmitted chlamydial infection of other sites: Secondary | ICD-10-CM | POA: Diagnosis not present

## 2022-10-22 DIAGNOSIS — Z91010 Allergy to peanuts: Secondary | ICD-10-CM | POA: Diagnosis not present

## 2022-10-22 DIAGNOSIS — R319 Hematuria, unspecified: Secondary | ICD-10-CM | POA: Insufficient documentation

## 2022-10-22 DIAGNOSIS — R109 Unspecified abdominal pain: Secondary | ICD-10-CM | POA: Diagnosis present

## 2022-10-22 DIAGNOSIS — A64 Unspecified sexually transmitted disease: Secondary | ICD-10-CM

## 2022-10-22 LAB — CBC WITH DIFFERENTIAL/PLATELET
Abs Immature Granulocytes: 0.03 10*3/uL (ref 0.00–0.07)
Basophils Absolute: 0.1 10*3/uL (ref 0.0–0.1)
Basophils Relative: 1 %
Eosinophils Absolute: 0.1 10*3/uL (ref 0.0–0.5)
Eosinophils Relative: 1 %
HCT: 44 % (ref 39.0–52.0)
Hemoglobin: 14.2 g/dL (ref 13.0–17.0)
Immature Granulocytes: 0 %
Lymphocytes Relative: 24 %
Lymphs Abs: 2.5 10*3/uL (ref 0.7–4.0)
MCH: 28.6 pg (ref 26.0–34.0)
MCHC: 32.3 g/dL (ref 30.0–36.0)
MCV: 88.5 fL (ref 80.0–100.0)
Monocytes Absolute: 1.4 10*3/uL — ABNORMAL HIGH (ref 0.1–1.0)
Monocytes Relative: 13 %
Neutro Abs: 6.3 10*3/uL (ref 1.7–7.7)
Neutrophils Relative %: 61 %
Platelets: 328 10*3/uL (ref 150–400)
RBC: 4.97 MIL/uL (ref 4.22–5.81)
RDW: 14.7 % (ref 11.5–15.5)
WBC: 10.4 10*3/uL (ref 4.0–10.5)
nRBC: 0 % (ref 0.0–0.2)

## 2022-10-22 LAB — URINALYSIS, ROUTINE W REFLEX MICROSCOPIC
Bacteria, UA: NONE SEEN
Bilirubin Urine: NEGATIVE
Glucose, UA: NEGATIVE mg/dL
Ketones, ur: NEGATIVE mg/dL
Nitrite: NEGATIVE
Protein, ur: 100 mg/dL — AB
RBC / HPF: >50 RBC/hpf (ref 0–5)
Specific Gravity, Urine: 1.021 (ref 1.005–1.030)
WBC, UA: >50 WBC/hpf (ref 0–5)
pH: 6 (ref 5.0–8.0)

## 2022-10-22 LAB — RPR: RPR Ser Ql: NONREACTIVE

## 2022-10-22 LAB — HIV ANTIBODY (ROUTINE TESTING W REFLEX): HIV Screen 4th Generation wRfx: NONREACTIVE

## 2022-10-22 MED ORDER — CEFTRIAXONE SODIUM 1 G IJ SOLR
500.0000 mg | Freq: Once | INTRAMUSCULAR | Status: AC
  Administered 2022-10-22: 500 mg via INTRAMUSCULAR
  Filled 2022-10-22: qty 10

## 2022-10-22 MED ORDER — LIDOCAINE HCL (PF) 1 % IJ SOLN
1.0000 mL | Freq: Once | INTRAMUSCULAR | Status: AC
  Administered 2022-10-22: 2.1 mL
  Filled 2022-10-22: qty 30

## 2022-10-22 MED ORDER — DOXYCYCLINE HYCLATE 100 MG PO TABS
100.0000 mg | ORAL_TABLET | Freq: Two times a day (BID) | ORAL | Status: DC
  Administered 2022-10-22 (×2): 100 mg via ORAL
  Filled 2022-10-22 (×2): qty 1

## 2022-10-22 MED ORDER — DOXYCYCLINE HYCLATE 100 MG PO CAPS: 100.0000 mg | ORAL_CAPSULE | Freq: Two times a day (BID) | ORAL | 0 refills | Status: AC

## 2022-10-22 NOTE — ED Triage Notes (Signed)
Pt c/o lower abdominal pain for about a week and blood in his urine today.

## 2022-10-22 NOTE — Discharge Instructions (Addendum)
Mr. Wann, seems like you most likely have a new STD.  We started treating you in the ED and please make sure to complete your course of doxycycline 100 mg twice a day for 6 more days.  You can check your MyChart or check with the health department for the results of your test.  If you notice worsening symptoms, please come back to the ED to be evaluated otherwise you can follow-up with your primary doctor.

## 2022-10-22 NOTE — ED Provider Notes (Signed)
[facility] Provider Note   CSN: [id number] Arrival date & time: 10/22/22  0601     History  Chief Complaint  Patient presents with   Abdominal Pain    Austin Patrick is a 42 y.o. male with no pertinent past medical history presenting for 3 days of abdominal pain, creamy discharge from his penis, hematuria.  He started noticing crampy suprapubic pain 2 to 3 days ago and then developed creamy penile discharge and dysuria.  This morning, he had 1 episode of hematuria and notes that his penis feels very inflamed.  Of note he had a recent divorce and is since then 28 new sexual partners.  He denies using any barrier protection.  He has never been tested for STDs and does is not aware of any of his partner's testing status.  He has never previously had any STIs or UTIs from his knowledge.  He has never been diagnosed or treated for HIV.  He has had no nausea or vomiting, fevers, chills, rashes, new ulcerations.      Home Medications Prior to Admission medications   Medication Sig Start Date End Date Taking? Authorizing Provider  HYDROcodone-acetaminophen (NORCO) 5-325 MG tablet Take 1 tablet by mouth every 4 (four) hours as needed for moderate pain. 02/27/17   Duanne Guess, PA-C  ibuprofen (ADVIL,MOTRIN) 600 MG tablet Take 1 tablet (600 mg total) by mouth every 6 (six) hours as needed for moderate pain. 02/27/17   Duanne Guess, PA-C  ibuprofen (ADVIL,MOTRIN) 600 MG tablet Take 1 tablet (600 mg total) by mouth every 8 (eight) hours as needed. 03/03/17   Menshew, Dannielle Karvonen, PA-C      Allergies    Peanut-containing drug products    Review of Systems   see HPI Physical Exam Updated Vital Signs BP (!) 132/93 (BP Location: Right Arm)   Pulse 61   Temp 97.8 F (36.6 C) (Oral)   Resp 18   Ht 6\' 2"  (1.88 m)   Wt 107.5 kg   SpO2 100%   BMI 30.43 kg/m  Physical Exam Constitutional:      General: He is not in acute distress.     Appearance: He is not ill-appearing.  Cardiovascular:     Rate and Rhythm: Normal rate and regular rhythm.     Heart sounds: Normal heart sounds.  Pulmonary:     Effort: Pulmonary effort is normal. No respiratory distress.     Breath sounds: Normal breath sounds.  Abdominal:     Tenderness: There is abdominal tenderness in the suprapubic area.  Genitourinary:    Comments: Dried blood and creamy white discharge from urethral meatus.  No visible rashes or ulcerations.  No lymphadenopathy. Skin:    General: Skin is warm and dry.  Neurological:     General: No focal deficit present.     Mental Status: He is alert.  Psychiatric:        Mood and Affect: Mood normal.     ED Results / Procedures / Treatments   Labs (all labs ordered are listed, but only abnormal results are displayed) Labs Reviewed  URINE CULTURE  URINALYSIS, ROUTINE W REFLEX MICROSCOPIC  HIV ANTIBODY (ROUTINE TESTING W REFLEX)  RPR  CBC WITH DIFFERENTIAL/PLATELET  GC/CHLAMYDIA PROBE AMP ([place]) NOT AT [facility]    EKG None  Radiology No results found.   Medications Ordered in ED Medications  cefTRIAXone (ROCEPHIN) injection 500 mg (has no administration in time range)  lidocaine (PF) (XYLOCAINE) 1 % injection 1-2.1 mL (has no administration in time range)  doxycycline (VIBRA-TABS) tablet 100 mg (has no administration in time range)    ED Course/ Medical Decision Making/ A&P   {                          Medical Decision Making Amount and/or Complexity of Data Reviewed Labs: ordered.  Risk Prescription drug management.   42 year old male with no pertinent past medical history presenting with suprapubic pain, dysuria, hematuria, urethral discharge after sexual encounters with several new partners.  DDx STI +\- UTI.  Low concern for pyelonephritis given presentation and exam.  Low concern for other intra-abdominal process, abscesses, disseminated disease.  Will empirically treat for gonorrhea chlamydia  and then follow-up initial STI and UTI workup.  He is medically stable for discharge at this time.  Instructed him to continue his course of doxycycline check on his lab results in the outpatient setting after establishing care with new PCP.  Final Clinical Impression(s) / ED Diagnoses Final diagnoses:  None    Rx / DC Orders ED Discharge Orders     None         Linus Galas, MD 10/22/22 1400    Carmin Muskrat, MD 10/29/22 2247

## 2022-10-23 LAB — URINE CYTOLOGY ANCILLARY ONLY
Chlamydia: NEGATIVE
Comment: NEGATIVE
Comment: NEGATIVE
Comment: NORMAL
Neisseria Gonorrhea: POSITIVE — AB
Trichomonas: POSITIVE — AB

## 2022-10-23 LAB — GC/CHLAMYDIA PROBE AMP (~~LOC~~) NOT AT ARMC
Chlamydia: NEGATIVE
Comment: NEGATIVE
Comment: NORMAL
Neisseria Gonorrhea: POSITIVE — AB

## 2022-10-23 LAB — URINE CULTURE: Culture: NO GROWTH

## 2023-08-05 ENCOUNTER — Emergency Department (HOSPITAL_COMMUNITY)
Admission: EM | Admit: 2023-08-05 | Discharge: 2023-08-06 | Discharge status: Against Medical Advice | Payer: 59 | Attending: Emergency Medicine | Admitting: Emergency Medicine

## 2023-08-05 ENCOUNTER — Other Ambulatory Visit: Payer: Self-pay

## 2023-08-05 ENCOUNTER — Encounter (HOSPITAL_COMMUNITY): Payer: Self-pay

## 2023-08-05 DIAGNOSIS — Z23 Encounter for immunization: Secondary | ICD-10-CM | POA: Insufficient documentation

## 2023-08-05 DIAGNOSIS — S80812A Abrasion, left lower leg, initial encounter: Secondary | ICD-10-CM | POA: Diagnosis not present

## 2023-08-05 DIAGNOSIS — W228XXA Striking against or struck by other objects, initial encounter: Secondary | ICD-10-CM | POA: Insufficient documentation

## 2023-08-05 DIAGNOSIS — F17200 Nicotine dependence, unspecified, uncomplicated: Secondary | ICD-10-CM | POA: Diagnosis not present

## 2023-08-05 DIAGNOSIS — S81812A Laceration without foreign body, left lower leg, initial encounter: Secondary | ICD-10-CM | POA: Diagnosis not present

## 2023-08-05 DIAGNOSIS — S81802D Unspecified open wound, left lower leg, subsequent encounter: Secondary | ICD-10-CM | POA: Diagnosis not present

## 2023-08-05 DIAGNOSIS — Y992 Volunteer activity: Secondary | ICD-10-CM | POA: Insufficient documentation

## 2023-08-05 DIAGNOSIS — Z91010 Allergy to peanuts: Secondary | ICD-10-CM | POA: Diagnosis not present

## 2023-08-05 DIAGNOSIS — Y990 Civilian activity done for income or pay: Secondary | ICD-10-CM | POA: Insufficient documentation

## 2023-08-05 DIAGNOSIS — S80819A Abrasion, unspecified lower leg, initial encounter: Secondary | ICD-10-CM | POA: Diagnosis not present

## 2023-08-05 NOTE — ED Triage Notes (Addendum)
Pt reports with a left lower leg laceration that occurred around 4 pm. Pt states that a cast iron fell on his leg. Pt reports that it was showing "white meat" and he put liquid bandage on it to close it up. Pt just wants to make sure that he did the right thing.

## 2023-08-06 ENCOUNTER — Emergency Department (HOSPITAL_COMMUNITY)
Admission: EM | Admit: 2023-08-06 | Discharge: 2023-08-06 | Disposition: A | Discharge status: Home / Self Care | Payer: 59 | Source: Home / Self Care

## 2023-08-06 ENCOUNTER — Encounter (HOSPITAL_COMMUNITY): Payer: Self-pay | Admitting: Emergency Medicine

## 2023-08-06 ENCOUNTER — Emergency Department (HOSPITAL_COMMUNITY): Payer: 59

## 2023-08-06 DIAGNOSIS — Y990 Civilian activity done for income or pay: Secondary | ICD-10-CM | POA: Insufficient documentation

## 2023-08-06 DIAGNOSIS — W268XXD Contact with other sharp object(s), not elsewhere classified, subsequent encounter: Secondary | ICD-10-CM | POA: Insufficient documentation

## 2023-08-06 DIAGNOSIS — S81802D Unspecified open wound, left lower leg, subsequent encounter: Secondary | ICD-10-CM | POA: Insufficient documentation

## 2023-08-06 DIAGNOSIS — Z91010 Allergy to peanuts: Secondary | ICD-10-CM | POA: Insufficient documentation

## 2023-08-06 DIAGNOSIS — F17200 Nicotine dependence, unspecified, uncomplicated: Secondary | ICD-10-CM | POA: Insufficient documentation

## 2023-08-06 DIAGNOSIS — S80819A Abrasion, unspecified lower leg, initial encounter: Secondary | ICD-10-CM | POA: Diagnosis not present

## 2023-08-06 DIAGNOSIS — Z23 Encounter for immunization: Secondary | ICD-10-CM | POA: Insufficient documentation

## 2023-08-06 DIAGNOSIS — S81812A Laceration without foreign body, left lower leg, initial encounter: Secondary | ICD-10-CM | POA: Diagnosis not present

## 2023-08-06 DIAGNOSIS — S81802A Unspecified open wound, left lower leg, initial encounter: Secondary | ICD-10-CM

## 2023-08-06 MED ORDER — TETANUS-DIPHTH-ACELL PERTUSSIS 5-2.5-18.5 LF-MCG/0.5 IM SUSY: 0.5000 mL | PREFILLED_SYRINGE | Freq: Once | INTRAMUSCULAR | Status: DC

## 2023-08-06 MED ORDER — TETANUS-DIPHTH-ACELL PERTUSSIS 5-2.5-18.5 LF-MCG/0.5 IM SUSY
0.5000 mL | PREFILLED_SYRINGE | Freq: Once | INTRAMUSCULAR | Status: AC
  Administered 2023-08-06: 0.5 mL via INTRAMUSCULAR
  Filled 2023-08-06: qty 0.5

## 2023-08-06 MED ORDER — CEPHALEXIN 500 MG PO CAPS: 500.0000 mg | ORAL_CAPSULE | Freq: Four times a day (QID) | ORAL | 0 refills | Status: AC

## 2023-08-06 MED ORDER — BACITRACIN ZINC 500 UNIT/GM EX OINT
TOPICAL_OINTMENT | Freq: Two times a day (BID) | CUTANEOUS | Status: DC
  Filled 2023-08-06: qty 0.9

## 2023-08-06 MED ORDER — CEPHALEXIN 500 MG PO CAPS
500.0000 mg | ORAL_CAPSULE | Freq: Once | ORAL | Status: AC
  Administered 2023-08-06: 500 mg via ORAL
  Filled 2023-08-06: qty 1

## 2023-08-06 NOTE — ED Notes (Signed)
Per pt request dressing applied to wound (gauze and coban)

## 2023-08-06 NOTE — ED Triage Notes (Signed)
  Patient comes in with L lower leg injury that occurred on 11/12 at work.  Patient states he dropped a cast iron block on his L around 1600 yesterday and attempted wound care at home.  Patient states he rinsed it with peroxide and poured a bottle of liquid skin on it.  Was triaged last night but left before seeing the EDP.  Endorsing painful ambulation and concerns with healing.  Pain 7/10, sharp/stabbing.

## 2023-08-06 NOTE — ED Provider Notes (Signed)
[place] EMERGENCY DEPARTMENT AT [facility] Provider Note   CSN: [id number] Arrival date & time: 08/06/23  2051     History  Chief Complaint  Patient presents with   Laceration   Leg Pain    Austin Patrick is a 42 y.o. male.  42 year old male presents with concern for left lower leg pain and wound.  Patient states that he was at work yesterday around 4 PM when his leg was injured by a cast iron piece of equipment.  Patient rinsed the area with peroxide and applied liquid skin glue.  He came to the emergency room last night and was seen, tetanus was ordered however patient did not receive the tetanus vaccine, it is unclear if he left prior to this time.  He reports ongoing pain in his left leg, worse with bearing weight, improved with elevating and resting the leg.  Last tetanus unknown.  No other complaints or concerns.       Home Medications Prior to Admission medications   Medication Sig Start Date End Date Taking? Authorizing Provider  cephALEXin (KEFLEX) 500 MG capsule Take 1 capsule (500 mg total) by mouth 4 (four) times daily for 7 days. 08/06/23 08/13/23 Yes Jeannie Fend, PA-C  HYDROcodone-acetaminophen (NORCO) 5-325 MG tablet Take 1 tablet by mouth every 4 (four) hours as needed for moderate pain. 02/27/17   Evon Slack, PA-C  ibuprofen (ADVIL,MOTRIN) 600 MG tablet Take 1 tablet (600 mg total) by mouth every 6 (six) hours as needed for moderate pain. 02/27/17   Evon Slack, PA-C  ibuprofen (ADVIL,MOTRIN) 600 MG tablet Take 1 tablet (600 mg total) by mouth every 8 (eight) hours as needed. 03/03/17   Menshew, Charlesetta Ivory, PA-C      Allergies    Peanut-containing drug products    Review of Systems   Review of Systems Negative except as per HPI Physical Exam Updated Vital Signs BP (!) 158/101   Pulse 88   Temp (!) 97.5 F (36.4 C) (Oral)   Resp 18   Ht 6\' 1"  (1.854 m)   Wt 104.3 kg   SpO2 100%   BMI 30.34 kg/m  Physical Exam Vitals and  nursing note reviewed.  Constitutional:      General: He is not in acute distress.    Appearance: He is well-developed. He is not diaphoretic.  HENT:     Head: Normocephalic and atraumatic.  Cardiovascular:     Pulses: Normal pulses.  Pulmonary:     Effort: Pulmonary effort is normal.  Musculoskeletal:        General: Swelling and tenderness present.     Comments: 15 cm left lower leg wound, initially concern for purulent material from the wound however on closer examination, this appears to be the skin glue (with bubbles noted in the dried glue).  Skin:    General: Skin is warm and dry.     Findings: No bruising, erythema or rash.  Neurological:     Mental Status: He is alert and oriented to person, place, and time.  Psychiatric:        Behavior: Behavior normal.     ED Results / Procedures / Treatments   Labs (all labs ordered are listed, but only abnormal results are displayed) Labs Reviewed - No data to display  EKG None  Radiology DG Tibia/Fibula Left  Result Date: 08/06/2023 CLINICAL DATA:  Preoperative cast iron block on leg with abrasion to medial side of the calf. EXAM: LEFT  TIBIA AND FIBULA - 2 VIEW COMPARISON:  None Available. FINDINGS: There is no evidence of fracture or other focal bone lesions. Soft tissues are radiographically unremarkable. IMPRESSION: No acute fracture or dislocation. Electronically Signed   By: Minerva Fester M.D.   On: 08/06/2023 21:26    Procedures Procedures    Medications Ordered in ED Medications  Tdap (BOOSTRIX) injection 0.5 mL (has no administration in time range)  bacitracin ointment (has no administration in time range)  cephALEXin (KEFLEX) capsule 500 mg (has no administration in time range)    ED Course/ Medical Decision Making/ A&P                                 Medical Decision Making Risk OTC drugs. Prescription drug management.   This patient presents to the ED for concern of left leg pain, wound, this  involves an extensive number of treatment options, and is a complaint that carries with it a high risk of complications and morbidity.  The differential diagnosis includes contusion, fracture, secondary infection   Co morbidities that complicate the patient evaluation  Daily smoker   Additional history obtained:  External records from outside source obtained and reviewed including x-ray obtained yesterday of the left lower leg which was negative for acute bony injury.   Problem List / ED Course / Critical interventions / Medication management  42 year old male with left lower leg wound.  Area appears to be healing, question if there is early stages of infection, will start Keflex.  Recommend apply bacitracin to the wound which will help remove the copious amount of skin glue that has been applied to his wound.  Recommend recheck with his Worker's Comp. provider in 2 days. I ordered medication including keflex, tdap, bacitracin  for leg wound  Reevaluation of the patient after these medicines showed that the patient stayed the same I have reviewed the patients home medicines and have made adjustments as needed   Social Determinants of Health:  WC injury, states he has reported the injury   Test / Admission - Considered:  Stable for discharge         Final Clinical Impression(s) / ED Diagnoses Final diagnoses:  Leg wound, left, initial encounter    Rx / DC Orders ED Discharge Orders          Ordered    cephALEXin (KEFLEX) 500 MG capsule  4 times daily        08/06/23 2229              Jeannie Fend, PA-C 08/06/23 2237    Charlynne Pander, MD 08/11/23 [phone]

## 2023-08-06 NOTE — ED Notes (Signed)
Pt declined tetanus shot and advises that he has to leave in order to go to work.  Pt left ambulatory, not able to wait any longer.  Fortunately pt appeared in no distress as he walked out.

## 2023-08-06 NOTE — Discharge Instructions (Signed)
Keflex as prescribed and complete the full course. Apply bacitracin to the wound twice daily, this is available over the counter.  Recheck with your worker's comp company in 2 days.

## 2023-08-06 NOTE — ED Notes (Signed)
Pt states that he has been waiting too long and is leaving.

## 2023-08-06 NOTE — ED Provider Notes (Signed)
[place] EMERGENCY DEPARTMENT AT [facility] Provider Note   CSN: [id number] Arrival date & time: 08/05/23  2259     History  Chief Complaint  Patient presents with   Extremity Laceration    Austin Patrick is a 42 y.o. male.  Patient presents to the emergency room complaining of a wound to the left anterior lower leg.  Patient states he was at work and scraped the front of his leg with an approximately 70 pound cast iron object.  He denies any difficulty with ambulation.  The patient states he washed the wound at home with both peroxide and alcohol.  He then covered the wound with a liquid bandage.  The patient is unsure of his last tetanus vaccine.  Past medical history noncontributory.  HPI     Home Medications Prior to Admission medications   Medication Sig Start Date End Date Taking? Authorizing Provider  HYDROcodone-acetaminophen (NORCO) 5-325 MG tablet Take 1 tablet by mouth every 4 (four) hours as needed for moderate pain. 02/27/17   Evon Slack, PA-C  ibuprofen (ADVIL,MOTRIN) 600 MG tablet Take 1 tablet (600 mg total) by mouth every 6 (six) hours as needed for moderate pain. 02/27/17   Evon Slack, PA-C  ibuprofen (ADVIL,MOTRIN) 600 MG tablet Take 1 tablet (600 mg total) by mouth every 8 (eight) hours as needed. 03/03/17   Menshew, Charlesetta Ivory, PA-C      Allergies    Peanut-containing drug products    Review of Systems   Review of Systems  Physical Exam Updated Vital Signs BP 129/73   Pulse 79   Temp 98 F (36.7 C) (Oral)   Resp 17   Ht 6\' 1"  (1.854 m)   Wt 104.3 kg   SpO2 96%   BMI 30.34 kg/m  Physical Exam Vitals and nursing note reviewed.  HENT:     Head: Normocephalic and atraumatic.  Eyes:     Pupils: Pupils are equal, round, and reactive to light.  Pulmonary:     Effort: Pulmonary effort is normal. No respiratory distress.  Musculoskeletal:        General: No signs of injury.     Cervical back: Normal range of motion.   Skin:    General: Skin is dry.     Comments: Abrasion/skin tear to the left lower leg/anterior.  No active bleeding at this time.  Wound covered with liquid bandage.  Neurological:     Mental Status: He is alert.  Psychiatric:        Speech: Speech normal.        Behavior: Behavior normal.     ED Results / Procedures / Treatments   Labs (all labs ordered are listed, but only abnormal results are displayed) Labs Reviewed - No data to display  EKG None  Radiology No results found.  Procedures Procedures    Medications Ordered in ED Medications  Tdap (BOOSTRIX) injection 0.5 mL (0.5 mLs Intramuscular Not Given 08/06/23 0046)    ED Course/ Medical Decision Making/ A&P                                 Medical Decision Making Risk Prescription drug management.   This patient presents to the ED for concern of leg wound, this involves an extensive number of treatment options, and is a complaint that carries with it a high risk of complications and morbidity.  The differential diagnosis includes  tissue injury, fracture, others   Problem List / ED Course / Critical interventions / Medication management   I ordered medication including Tdap   I have reviewed the patients home medicines and have made adjustments as needed   Social Determinants of Health:  Patient is a smoker   Test / Admission - Considered:  Patient with bleeding controlled.  No indication for closure at this time.  Wound appears to be abrasion/skin tear.  Patient reports cleaning the wound well at home.  Tdap updated.  Patient to discharge home at this time.         Final Clinical Impression(s) / ED Diagnoses Final diagnoses:  Abrasion of left lower extremity, initial encounter    Rx / DC Orders ED Discharge Orders     None         Pamala Duffel 08/06/23 0050    Nira Conn, MD 08/06/23 0800

## 2023-08-26 ENCOUNTER — Telehealth: Payer: Self-pay

## 2023-08-26 NOTE — Telephone Encounter (Signed)
Transition Care Management Follow-up Telephone Call Date of discharge and from where: 08/06/2023 [facility] How have you been since you were released from the hospital? Patient stated he is not feeling any better and his leg still looks infected. He has followed up with the workers comp physician at his job. Any questions or concerns? No  Items Reviewed: Did the pt receive and understand the discharge instructions provided? Yes  Medications obtained and verified? Yes  Other?  Per patient request I am emailing a food pantry list and utility assistance information. Any new allergies since your discharge? No  Dietary orders reviewed? Yes Do you have support at home?  Patient stated his neighbors have been helping him.  Follow up appointments reviewed:  PCP Hospital f/u appt confirmed?  Patient stated he has followed up with the workers comp physician.  Scheduled to see  on  @ . Specialist Hospital f/u appt confirmed? No  Scheduled to see  on  @ . Are transportation arrangements needed? No  If their condition worsens, is the pt aware to call PCP or go to the Emergency Dept.? Yes Was the patient provided with contact information for the PCP's office or ED? Yes Was to pt encouraged to call back with questions or concerns? Yes   Melyna Huron [facility]  [facility], [facility] Guide Direct Dial: [phone]  Website: [email or web address]

## 2023-10-22 DIAGNOSIS — L97822 Non-pressure chronic ulcer of other part of left lower leg with fat layer exposed: Secondary | ICD-10-CM | POA: Diagnosis not present

## 2023-10-29 DIAGNOSIS — W208XXD Other cause of strike by thrown, projected or falling object, subsequent encounter: Secondary | ICD-10-CM | POA: Diagnosis not present

## 2023-10-29 DIAGNOSIS — Y990 Civilian activity done for income or pay: Secondary | ICD-10-CM | POA: Diagnosis not present

## 2023-10-29 DIAGNOSIS — S81802D Unspecified open wound, left lower leg, subsequent encounter: Secondary | ICD-10-CM | POA: Diagnosis not present

## 2023-10-29 DIAGNOSIS — L97222 Non-pressure chronic ulcer of left calf with fat layer exposed: Secondary | ICD-10-CM | POA: Diagnosis not present

## 2023-11-04 DIAGNOSIS — R29898 Other symptoms and signs involving the musculoskeletal system: Secondary | ICD-10-CM | POA: Diagnosis not present

## 2023-11-04 DIAGNOSIS — Y990 Civilian activity done for income or pay: Secondary | ICD-10-CM | POA: Diagnosis not present

## 2023-11-04 DIAGNOSIS — L97222 Non-pressure chronic ulcer of left calf with fat layer exposed: Secondary | ICD-10-CM | POA: Diagnosis not present

## 2023-11-04 DIAGNOSIS — M25572 Pain in left ankle and joints of left foot: Secondary | ICD-10-CM | POA: Diagnosis not present

## 2023-11-11 DIAGNOSIS — L97222 Non-pressure chronic ulcer of left calf with fat layer exposed: Secondary | ICD-10-CM | POA: Diagnosis not present

## 2023-11-11 DIAGNOSIS — R29898 Other symptoms and signs involving the musculoskeletal system: Secondary | ICD-10-CM | POA: Diagnosis not present

## 2023-11-11 DIAGNOSIS — Y990 Civilian activity done for income or pay: Secondary | ICD-10-CM | POA: Diagnosis not present

## 2023-11-11 DIAGNOSIS — M25572 Pain in left ankle and joints of left foot: Secondary | ICD-10-CM | POA: Diagnosis not present

## 2023-11-13 DIAGNOSIS — Y990 Civilian activity done for income or pay: Secondary | ICD-10-CM | POA: Diagnosis not present

## 2023-11-13 DIAGNOSIS — M25572 Pain in left ankle and joints of left foot: Secondary | ICD-10-CM | POA: Diagnosis not present

## 2023-11-13 DIAGNOSIS — L97222 Non-pressure chronic ulcer of left calf with fat layer exposed: Secondary | ICD-10-CM | POA: Diagnosis not present

## 2023-11-13 DIAGNOSIS — R29898 Other symptoms and signs involving the musculoskeletal system: Secondary | ICD-10-CM | POA: Diagnosis not present

## 2023-11-18 DIAGNOSIS — M25572 Pain in left ankle and joints of left foot: Secondary | ICD-10-CM | POA: Diagnosis not present

## 2023-11-18 DIAGNOSIS — R29898 Other symptoms and signs involving the musculoskeletal system: Secondary | ICD-10-CM | POA: Diagnosis not present

## 2023-11-18 DIAGNOSIS — L97222 Non-pressure chronic ulcer of left calf with fat layer exposed: Secondary | ICD-10-CM | POA: Diagnosis not present

## 2023-11-18 DIAGNOSIS — Y990 Civilian activity done for income or pay: Secondary | ICD-10-CM | POA: Diagnosis not present

## 2023-11-20 DIAGNOSIS — R29898 Other symptoms and signs involving the musculoskeletal system: Secondary | ICD-10-CM | POA: Diagnosis not present

## 2023-11-20 DIAGNOSIS — L97222 Non-pressure chronic ulcer of left calf with fat layer exposed: Secondary | ICD-10-CM | POA: Diagnosis not present

## 2023-11-20 DIAGNOSIS — M25572 Pain in left ankle and joints of left foot: Secondary | ICD-10-CM | POA: Diagnosis not present

## 2023-11-20 DIAGNOSIS — Y990 Civilian activity done for income or pay: Secondary | ICD-10-CM | POA: Diagnosis not present

## 2023-11-25 DIAGNOSIS — M25572 Pain in left ankle and joints of left foot: Secondary | ICD-10-CM | POA: Diagnosis not present

## 2023-11-25 DIAGNOSIS — L97222 Non-pressure chronic ulcer of left calf with fat layer exposed: Secondary | ICD-10-CM | POA: Diagnosis not present

## 2023-11-25 DIAGNOSIS — Y990 Civilian activity done for income or pay: Secondary | ICD-10-CM | POA: Diagnosis not present

## 2023-11-25 DIAGNOSIS — R29898 Other symptoms and signs involving the musculoskeletal system: Secondary | ICD-10-CM | POA: Diagnosis not present

## 2023-11-26 DIAGNOSIS — L905 Scar conditions and fibrosis of skin: Secondary | ICD-10-CM | POA: Diagnosis not present

## 2023-11-26 DIAGNOSIS — W208XXD Other cause of strike by thrown, projected or falling object, subsequent encounter: Secondary | ICD-10-CM | POA: Diagnosis not present

## 2023-11-26 DIAGNOSIS — L97222 Non-pressure chronic ulcer of left calf with fat layer exposed: Secondary | ICD-10-CM | POA: Diagnosis not present

## 2023-11-26 DIAGNOSIS — Y990 Civilian activity done for income or pay: Secondary | ICD-10-CM | POA: Diagnosis not present
